# Patient Record
Sex: Female | Born: 1950 | Race: White | Hispanic: No | Marital: Married | Smoking: Never smoker
Health system: Southern US, Community
[De-identification: ages and names within clinical notes are randomized; demographics above are authoritative.]

## PROBLEM LIST (undated history)

## (undated) DIAGNOSIS — E7800 Pure hypercholesterolemia, unspecified: Secondary | ICD-10-CM

## (undated) HISTORY — DX: Pure hypercholesterolemia, unspecified: E78.00

## (undated) HISTORY — PX: TUMOR REMOVAL: SHX12

## (undated) HISTORY — PX: BREAST BIOPSY: SHX20

---

## 2003-12-12 ENCOUNTER — Ambulatory Visit: Payer: Self-pay | Admitting: General Surgery

## 2004-12-09 ENCOUNTER — Ambulatory Visit: Payer: Self-pay | Admitting: General Surgery

## 2004-12-30 ENCOUNTER — Ambulatory Visit: Payer: Self-pay | Admitting: General Surgery

## 2006-01-05 ENCOUNTER — Ambulatory Visit: Payer: Self-pay | Admitting: General Surgery

## 2007-01-15 ENCOUNTER — Ambulatory Visit: Payer: Self-pay | Admitting: General Surgery

## 2008-01-19 ENCOUNTER — Ambulatory Visit: Payer: Self-pay | Admitting: General Surgery

## 2008-02-03 ENCOUNTER — Ambulatory Visit: Payer: Self-pay

## 2009-01-29 ENCOUNTER — Ambulatory Visit: Payer: Self-pay | Admitting: Unknown Physician Specialty

## 2009-06-15 ENCOUNTER — Ambulatory Visit: Payer: Self-pay | Admitting: Unknown Physician Specialty

## 2010-01-30 ENCOUNTER — Ambulatory Visit: Payer: Self-pay

## 2010-02-06 ENCOUNTER — Ambulatory Visit: Payer: Self-pay

## 2011-02-04 ENCOUNTER — Ambulatory Visit: Payer: Self-pay

## 2012-02-09 ENCOUNTER — Ambulatory Visit: Payer: Self-pay

## 2013-02-04 ENCOUNTER — Emergency Department: Payer: Self-pay | Admitting: Emergency Medicine

## 2013-02-04 LAB — COMPREHENSIVE METABOLIC PANEL
Alkaline Phosphatase: 70 U/L
Anion Gap: 5 — ABNORMAL LOW (ref 7–16)
BUN: 15 mg/dL (ref 7–18)
Bilirubin,Total: 0.3 mg/dL (ref 0.2–1.0)
Calcium, Total: 8.8 mg/dL (ref 8.5–10.1)
Chloride: 108 mmol/L — ABNORMAL HIGH (ref 98–107)
Creatinine: 0.72 mg/dL (ref 0.60–1.30)
EGFR (African American): >60
Glucose: 117 mg/dL — ABNORMAL HIGH (ref 65–99)
Potassium: 4 mmol/L (ref 3.5–5.1)
SGOT(AST): 35 U/L (ref 15–37)
Sodium: 140 mmol/L (ref 136–145)
Total Protein: 6 g/dL — ABNORMAL LOW (ref 6.4–8.2)

## 2013-02-04 LAB — CBC
MCH: 30.1 pg (ref 26.0–34.0)
MCHC: 33.7 g/dL (ref 32.0–36.0)
MCV: 89 fL (ref 80–100)
WBC: 7.9 10*3/uL (ref 3.6–11.0)

## 2013-02-04 LAB — CK TOTAL AND CKMB (NOT AT ARMC): CK-MB: 1 ng/mL (ref 0.5–3.6)

## 2013-02-04 LAB — TROPONIN I: Troponin-I: <0.02 ng/mL

## 2013-02-04 LAB — APTT: Activated PTT: 23.9 secs (ref 23.6–35.9)

## 2013-02-10 ENCOUNTER — Ambulatory Visit: Payer: Self-pay

## 2013-03-11 ENCOUNTER — Encounter: Payer: Self-pay | Admitting: Podiatry

## 2013-03-11 ENCOUNTER — Ambulatory Visit (INDEPENDENT_AMBULATORY_CARE_PROVIDER_SITE_OTHER): Payer: BC Managed Care – PPO | Admitting: Podiatry

## 2013-03-11 ENCOUNTER — Ambulatory Visit (INDEPENDENT_AMBULATORY_CARE_PROVIDER_SITE_OTHER): Payer: BC Managed Care – PPO

## 2013-03-11 VITALS — BP 111/69 | HR 93 | Resp 16 | Ht 65.0 in | Wt 165.0 lb

## 2013-03-11 DIAGNOSIS — M79609 Pain in unspecified limb: Secondary | ICD-10-CM

## 2013-03-11 DIAGNOSIS — M79672 Pain in left foot: Secondary | ICD-10-CM

## 2013-03-11 DIAGNOSIS — M722 Plantar fascial fibromatosis: Secondary | ICD-10-CM

## 2013-03-11 MED ORDER — MELOXICAM 15 MG PO TABS: 15.0000 mg | ORAL_TABLET | Freq: Every day | ORAL | Status: DC

## 2013-03-11 MED ORDER — TRIAMCINOLONE ACETONIDE 10 MG/ML IJ SUSP
10.0000 mg | Freq: Once | INTRAMUSCULAR | Status: AC
  Administered 2013-03-11: 10 mg

## 2013-03-11 NOTE — Patient Instructions (Signed)

## 2013-03-13 NOTE — Progress Notes (Signed)
Subjective:     Patient ID: Kim Spencer, female   DOB: 12/13/1950, [age] y.o.   MRN: [id number]  Foot Pain   patient presents with pain in her left heel of several months duration. States it has been very sore and worse when she gets up in the morning after sitting and now hurts when walking during the day  Review of Systems  All other systems reviewed and are negative.       Objective:   Physical Exam  Nursing note and vitals reviewed. Constitutional: She is oriented to person, place, and time.  Cardiovascular: Intact distal pulses.   Musculoskeletal: Normal range of motion.  Neurological: She is oriented to person, place, and time.  Skin: Skin is warm.   neurovascular status is intact with muscle strength adequate and aches as it discomfort plantar aspect heel at the insertional point the tendon the calcaneus noted no equinus condition was noted currently     Assessment:     Plantar fasciitis acute left heel    Plan:     H&P and x-ray reviewed and today I injected the left plantar fascia 3 mg Kenalog 5 mg Xylocaine Marcaine mixture and applied fascially brace in order to support the arch. Reappoint her recheck

## 2013-03-18 ENCOUNTER — Ambulatory Visit: Payer: BC Managed Care – PPO | Admitting: Podiatry

## 2013-03-31 ENCOUNTER — Ambulatory Visit: Payer: Self-pay | Admitting: Neurology

## 2013-09-20 ENCOUNTER — Ambulatory Visit (INDEPENDENT_AMBULATORY_CARE_PROVIDER_SITE_OTHER): Payer: BC Managed Care – PPO | Admitting: Podiatry

## 2013-09-20 ENCOUNTER — Ambulatory Visit (INDEPENDENT_AMBULATORY_CARE_PROVIDER_SITE_OTHER): Payer: BC Managed Care – PPO

## 2013-09-20 VITALS — BP 119/73 | HR 93 | Resp 16

## 2013-09-20 DIAGNOSIS — M7661 Achilles tendinitis, right leg: Secondary | ICD-10-CM

## 2013-09-20 DIAGNOSIS — M7660 Achilles tendinitis, unspecified leg: Secondary | ICD-10-CM

## 2013-09-20 DIAGNOSIS — M722 Plantar fascial fibromatosis: Secondary | ICD-10-CM

## 2013-09-20 MED ORDER — DICLOFENAC SODIUM 75 MG PO TBEC: 75.0000 mg | DELAYED_RELEASE_TABLET | Freq: Two times a day (BID) | ORAL | Status: AC

## 2013-09-20 MED ORDER — TRIAMCINOLONE ACETONIDE 10 MG/ML IJ SUSP
10.0000 mg | Freq: Once | INTRAMUSCULAR | Status: AC
  Administered 2013-09-20: 10 mg

## 2013-09-20 NOTE — Patient Instructions (Signed)

## 2013-09-20 NOTE — Progress Notes (Signed)
Subjective:     Patient ID: Kim Spencer, female   DOB: 07/25/1950, 63 y.o.   MRN: [id number]  HPI patient presents stating I'm having a lot of pain on the back of my right heel and on the outside of my left foot. States they've been present for about 6 weeks and she's tried to stretch and ice without relief   Review of Systems     Objective:   Physical Exam Neurovascular status is intact with muscle strength and range of motion adequate. Exquisite discomfort posterior lateral aspect of the right heel and discomfort at the peroneal insertion fifth metatarsal left with fluid buildup noted around the area    Assessment:     Inflamed Achilles tendinitis right and peroneal tendinitis left    Plan:     Reviewed both conditions and discussed injection and the risk of doing this. Patient wants to accept the risk of this and today I injected the posterior lateral aspect right heel 3 mg dexamethasone Kenalog 5 mg Xylocaine and the peroneal insertion 3 mg Kenalog 5 mg I can Marcaine mixture and instructed on reduced activity for the next several days. Placed on diclofenac 75 mg twice a day for the next several weeks and reappoint to be seen

## 2013-10-11 ENCOUNTER — Ambulatory Visit (INDEPENDENT_AMBULATORY_CARE_PROVIDER_SITE_OTHER): Payer: BC Managed Care – PPO | Admitting: Podiatry

## 2013-10-11 VITALS — BP 128/80 | HR 76 | Resp 16

## 2013-10-11 DIAGNOSIS — M722 Plantar fascial fibromatosis: Secondary | ICD-10-CM

## 2013-10-11 DIAGNOSIS — M7661 Achilles tendinitis, right leg: Secondary | ICD-10-CM

## 2013-10-11 DIAGNOSIS — M7660 Achilles tendinitis, unspecified leg: Secondary | ICD-10-CM

## 2013-10-11 NOTE — Progress Notes (Signed)
Subjective:     Patient ID: Kim Spencer, female   DOB: 12/16/1950, 63 y.o.   MRN: [id number]  HPI patient states I'm doing so much better with my feet they are both feeling good   Review of Systems     Objective:   Physical Exam Neurovascular status intact with significant diminishment of discomfort Achilles tendon right and plantar fascia left with minimal discomfort upon palpation    Assessment:     Doing well post Achilles tendinitis fasciitis left    Plan:     Explained physical therapy and types of shoe gear that I think would be best for her and reviewed activity levels and if symptoms persist we will make her orthotic devices. We schedule as needed

## 2014-03-06 ENCOUNTER — Ambulatory Visit: Payer: Self-pay | Admitting: Family Medicine

## 2014-04-18 ENCOUNTER — Telehealth: Payer: Self-pay | Admitting: *Deleted

## 2014-04-18 ENCOUNTER — Encounter: Payer: Self-pay | Admitting: Podiatry

## 2014-04-18 ENCOUNTER — Ambulatory Visit (INDEPENDENT_AMBULATORY_CARE_PROVIDER_SITE_OTHER): Payer: BLUE CROSS/BLUE SHIELD | Admitting: Podiatry

## 2014-04-18 VITALS — BP 101/68 | HR 91 | Resp 16

## 2014-04-18 DIAGNOSIS — M79672 Pain in left foot: Secondary | ICD-10-CM

## 2014-04-18 DIAGNOSIS — M722 Plantar fascial fibromatosis: Secondary | ICD-10-CM

## 2014-04-18 MED ORDER — DICLOFENAC SODIUM 75 MG PO TBEC: 75.0000 mg | DELAYED_RELEASE_TABLET | Freq: Two times a day (BID) | ORAL | Status: AC

## 2014-04-18 MED ORDER — TRIAMCINOLONE ACETONIDE 10 MG/ML IJ SUSP
10.0000 mg | Freq: Once | INTRAMUSCULAR | Status: AC
  Administered 2014-04-18: 10 mg

## 2014-04-18 NOTE — Patient Instructions (Signed)
Wear nite splint right to sleep in and 30-45 min in the evening on the left one

## 2014-04-18 NOTE — Telephone Encounter (Signed)
Pt states she was seen today and did not pick up a prescription for Diclofenac when she left.  I called pt and informed her [facility] [address] at [facility].  Pt states she'll go get it.

## 2014-04-19 NOTE — Progress Notes (Signed)
Subjective:     Patient ID: Kim Spencer, female   DOB: 12/23/1950, 64 y.o.   MRN: [id number]  HPI patient states that the back of my heel has been doing well but I been having a lot of pain in the bottom of both my heels with the right one now hurting more than my left one over the last several months. Admits she should've been in earlier   Review of Systems     Objective:   Physical Exam Neurovascular status intact with muscle strength adequate range of motion within normal limits. Patient's noted to have intense discomfort plantar aspect of the heel I lateral with inflammation and fluid at the insertion of the tendon into the calcaneus    Assessment:     Acute plantar fasciitis bilateral with structural changes that indicate that this is more mechanical and structural    Plan:     Education rendered and gave instructions on physical therapy. Placed back on diclofenac 75 mg and injected the plantar heel bilateral 3 mg Kenalog 5 mg Xylocaine and advised on reduced activity. Dispensed night splint with instructions on usage and also at this time scanned for custom orthotics to reduce stress against her feet

## 2014-05-12 ENCOUNTER — Ambulatory Visit: Payer: BLUE CROSS/BLUE SHIELD | Admitting: Podiatry

## 2014-05-23 ENCOUNTER — Ambulatory Visit: Payer: BLUE CROSS/BLUE SHIELD | Admitting: Podiatry

## 2014-06-02 ENCOUNTER — Encounter: Payer: Self-pay | Admitting: Podiatry

## 2014-06-02 ENCOUNTER — Ambulatory Visit (INDEPENDENT_AMBULATORY_CARE_PROVIDER_SITE_OTHER): Payer: BLUE CROSS/BLUE SHIELD | Admitting: Podiatry

## 2014-06-02 DIAGNOSIS — M722 Plantar fascial fibromatosis: Secondary | ICD-10-CM

## 2014-06-02 NOTE — Progress Notes (Signed)
Subjective:     Patient ID: Kim BookmanPamela Joyce Spencer, female   DOB: 09/25/1950, 64 y.o.   MRN: [id number]  HPI patient presents stating her heels are improving and the orthotics seem to be helping   Review of Systems     Objective:   Physical Exam Neurovascular status intact with significant diminishment of discomfort in the plantar heel of both feet with inflammation and fluid occurring around the medial band and quite a bit better from previous visit    Assessment:     Plantar fasciitis bilateral that is improving    Plan:     Spent a great deal of time reviewing physical therapy anti-inflammatories orthotic usage and night splint usage. Reappoint to recheck

## 2014-06-02 NOTE — Patient Instructions (Signed)

## 2014-11-07 ENCOUNTER — Other Ambulatory Visit: Payer: Self-pay | Admitting: Family Medicine

## 2014-11-11 ENCOUNTER — Telehealth: Payer: Self-pay | Admitting: Family Medicine

## 2014-11-11 NOTE — Telephone Encounter (Signed)
Left message to call back  

## 2014-11-11 NOTE — Telephone Encounter (Signed)
Pt states she is going out of the country to [place] and is asking if all her immunizations are up to date.  ZO#[phone]/[id number]

## 2014-11-13 NOTE — Telephone Encounter (Signed)
Advised patient that immunizations are up to date. Offered flu vaccine to patient and she declined.
